# Patient Record
Sex: Male | Born: 1945 | Race: White | Hispanic: No | Marital: Married | Smoking: Never smoker
Health system: Southern US, Community
[De-identification: ages and names within clinical notes are randomized; demographics above are authoritative.]

## PROBLEM LIST (undated history)

## (undated) DIAGNOSIS — I2510 Atherosclerotic heart disease of native coronary artery without angina pectoris: Secondary | ICD-10-CM

## (undated) DIAGNOSIS — H8109 Meniere's disease, unspecified ear: Secondary | ICD-10-CM

---

## 2020-10-16 ENCOUNTER — Other Ambulatory Visit: Payer: Self-pay

## 2020-10-16 ENCOUNTER — Emergency Department (HOSPITAL_COMMUNITY): Payer: Medicare Other

## 2020-10-16 ENCOUNTER — Inpatient Hospital Stay (HOSPITAL_COMMUNITY)
Admission: EM | Admit: 2020-10-16 | Discharge: 2020-10-19 | DRG: 247 | Disposition: A | Discharge status: Home / Self Care | Payer: Medicare Other | Attending: Cardiovascular Disease | Admitting: Cardiovascular Disease

## 2020-10-16 ENCOUNTER — Encounter (HOSPITAL_COMMUNITY): Payer: Self-pay

## 2020-10-16 DIAGNOSIS — I2109 ST elevation (STEMI) myocardial infarction involving other coronary artery of anterior wall: Secondary | ICD-10-CM | POA: Diagnosis present

## 2020-10-16 DIAGNOSIS — I071 Rheumatic tricuspid insufficiency: Secondary | ICD-10-CM | POA: Diagnosis present

## 2020-10-16 DIAGNOSIS — Z881 Allergy status to other antibiotic agents status: Secondary | ICD-10-CM

## 2020-10-16 DIAGNOSIS — E1165 Type 2 diabetes mellitus with hyperglycemia: Secondary | ICD-10-CM | POA: Diagnosis present

## 2020-10-16 DIAGNOSIS — Z955 Presence of coronary angioplasty implant and graft: Secondary | ICD-10-CM

## 2020-10-16 DIAGNOSIS — I249 Acute ischemic heart disease, unspecified: Secondary | ICD-10-CM | POA: Diagnosis present

## 2020-10-16 DIAGNOSIS — K219 Gastro-esophageal reflux disease without esophagitis: Secondary | ICD-10-CM | POA: Diagnosis present

## 2020-10-16 DIAGNOSIS — I2510 Atherosclerotic heart disease of native coronary artery without angina pectoris: Secondary | ICD-10-CM | POA: Diagnosis present

## 2020-10-16 DIAGNOSIS — I255 Ischemic cardiomyopathy: Secondary | ICD-10-CM | POA: Diagnosis present

## 2020-10-16 DIAGNOSIS — I214 Non-ST elevation (NSTEMI) myocardial infarction: Secondary | ICD-10-CM

## 2020-10-16 DIAGNOSIS — Z888 Allergy status to other drugs, medicaments and biological substances status: Secondary | ICD-10-CM

## 2020-10-16 DIAGNOSIS — H8109 Meniere's disease, unspecified ear: Secondary | ICD-10-CM | POA: Diagnosis present

## 2020-10-16 DIAGNOSIS — Z20822 Contact with and (suspected) exposure to covid-19: Secondary | ICD-10-CM | POA: Diagnosis present

## 2020-10-16 DIAGNOSIS — Z79899 Other long term (current) drug therapy: Secondary | ICD-10-CM

## 2020-10-16 DIAGNOSIS — G40909 Epilepsy, unspecified, not intractable, without status epilepticus: Secondary | ICD-10-CM | POA: Diagnosis present

## 2020-10-16 HISTORY — DX: Atherosclerotic heart disease of native coronary artery without angina pectoris: I25.10

## 2020-10-16 HISTORY — DX: Meniere's disease, unspecified ear: H81.09

## 2020-10-16 LAB — BASIC METABOLIC PANEL
Anion gap: 11 (ref 5–15)
BUN: 14 mg/dL (ref 8–23)
CO2: 23 mmol/L (ref 22–32)
Calcium: 8.6 mg/dL — ABNORMAL LOW (ref 8.9–10.3)
Chloride: 99 mmol/L (ref 98–111)
Creatinine, Ser: 1.01 mg/dL (ref 0.61–1.24)
GFR, Estimated: >60 mL/min (ref >60)
Glucose, Bld: 134 mg/dL — ABNORMAL HIGH (ref 70–99)
Potassium: 3.9 mmol/L (ref 3.5–5.1)
Sodium: 133 mmol/L — ABNORMAL LOW (ref 135–145)

## 2020-10-16 LAB — CBC WITH DIFFERENTIAL/PLATELET
Abs Immature Granulocytes: 0.03 10*3/uL (ref 0.00–0.07)
Basophils Absolute: 0 10*3/uL (ref 0.0–0.1)
Basophils Relative: 1 %
Eosinophils Absolute: 0.3 10*3/uL (ref 0.0–0.5)
Eosinophils Relative: 3 %
HCT: 43.5 % (ref 39.0–52.0)
Hemoglobin: 14.6 g/dL (ref 13.0–17.0)
Immature Granulocytes: 0 %
Lymphocytes Relative: 34 %
Lymphs Abs: 3 10*3/uL (ref 0.7–4.0)
MCH: 32.3 pg (ref 26.0–34.0)
MCHC: 33.6 g/dL (ref 30.0–36.0)
MCV: 96.2 fL (ref 80.0–100.0)
Monocytes Absolute: 0.7 10*3/uL (ref 0.1–1.0)
Monocytes Relative: 7 %
Neutro Abs: 4.8 10*3/uL (ref 1.7–7.7)
Neutrophils Relative %: 55 %
Platelets: 228 10*3/uL (ref 150–400)
RBC: 4.52 MIL/uL (ref 4.22–5.81)
RDW: 12.3 % (ref 11.5–15.5)
WBC: 8.8 10*3/uL (ref 4.0–10.5)
nRBC: 0 % (ref 0.0–0.2)

## 2020-10-16 LAB — TROPONIN I (HIGH SENSITIVITY)
Troponin I (High Sensitivity): 530 ng/L (ref <18)
Troponin I (High Sensitivity): 1700 ng/L (ref <18)

## 2020-10-16 LAB — RESPIRATORY PANEL BY RT PCR (FLU A&B, COVID)
Influenza A by PCR: NEGATIVE
Influenza B by PCR: NEGATIVE
SARS Coronavirus 2 by RT PCR: NEGATIVE

## 2020-10-16 LAB — BRAIN NATRIURETIC PEPTIDE: B Natriuretic Peptide: 36.3 pg/mL (ref 0.0–100.0)

## 2020-10-16 MED ORDER — ATORVASTATIN CALCIUM 80 MG PO TABS
80.0000 mg | ORAL_TABLET | Freq: Every day | ORAL | Status: DC
  Administered 2020-10-18 – 2020-10-19 (×2): 80 mg via ORAL
  Filled 2020-10-16 (×2): qty 1

## 2020-10-16 MED ORDER — SODIUM CHLORIDE 0.9% FLUSH: 3.0000 mL | INTRAVENOUS | Status: DC | PRN

## 2020-10-16 MED ORDER — NITROGLYCERIN IN D5W 200-5 MCG/ML-% IV SOLN
0.0000 ug/min | INTRAVENOUS | Status: DC
  Administered 2020-10-16: 5 ug/min via INTRAVENOUS
  Administered 2020-10-17: 15 ug/min via INTRAVENOUS
  Filled 2020-10-16: qty 250

## 2020-10-16 MED ORDER — FAMOTIDINE 20 MG PO TABS
40.0000 mg | ORAL_TABLET | Freq: Every day | ORAL | Status: DC
  Administered 2020-10-17 – 2020-10-19 (×3): 40 mg via ORAL
  Filled 2020-10-16 (×3): qty 2

## 2020-10-16 MED ORDER — HEPARIN SODIUM (PORCINE) 5000 UNIT/ML IJ SOLN
4000.0000 [IU] | Freq: Once | INTRAMUSCULAR | Status: AC
  Administered 2020-10-16: 4000 [IU] via INTRAVENOUS
  Filled 2020-10-16: qty 1

## 2020-10-16 MED ORDER — HEPARIN (PORCINE) 25000 UT/250ML-% IV SOLN
1000.0000 [IU]/h | INTRAVENOUS | Status: DC
  Administered 2020-10-16 – 2020-10-17 (×2): 1000 [IU]/h via INTRAVENOUS
  Filled 2020-10-16: qty 250

## 2020-10-16 MED ORDER — ASPIRIN EC 81 MG PO TBEC: 81.0000 mg | DELAYED_RELEASE_TABLET | Freq: Every day | ORAL | Status: DC

## 2020-10-16 MED ORDER — SODIUM CHLORIDE 0.9 % IV SOLN: 250.0000 mL | INTRAVENOUS | Status: DC | PRN

## 2020-10-16 MED ORDER — ONDANSETRON HCL 4 MG/2ML IJ SOLN: 4.0000 mg | Freq: Four times a day (QID) | INTRAMUSCULAR | Status: DC | PRN

## 2020-10-16 MED ORDER — ACETAMINOPHEN 325 MG PO TABS: 650.0000 mg | ORAL_TABLET | ORAL | Status: DC | PRN

## 2020-10-16 MED ORDER — SODIUM CHLORIDE 0.9% FLUSH
3.0000 mL | Freq: Two times a day (BID) | INTRAVENOUS | Status: DC
  Administered 2020-10-17 – 2020-10-19 (×3): 3 mL via INTRAVENOUS

## 2020-10-16 MED ORDER — METOPROLOL SUCCINATE ER 25 MG PO TB24
25.0000 mg | ORAL_TABLET | Freq: Every day | ORAL | Status: DC
  Administered 2020-10-18 – 2020-10-19 (×2): 25 mg via ORAL
  Filled 2020-10-16 (×2): qty 1

## 2020-10-16 MED ORDER — PHENYTOIN SODIUM EXTENDED 100 MG PO CAPS
200.0000 mg | ORAL_CAPSULE | Freq: Two times a day (BID) | ORAL | Status: DC
  Administered 2020-10-17 – 2020-10-19 (×4): 200 mg via ORAL
  Filled 2020-10-16 (×5): qty 2

## 2020-10-16 MED ORDER — ALPRAZOLAM 0.25 MG PO TABS: 0.2500 mg | ORAL_TABLET | Freq: Two times a day (BID) | ORAL | Status: DC | PRN

## 2020-10-16 NOTE — ED Provider Notes (Signed)
[facility]   CSN: [id number] Arrival date & time: 10/16/20  1914     History Chief Complaint  Patient presents with  . Chest Pain    Joshua LauthRobert L Kent is a 74 y.o. male.  The history is provided by the patient and medical records.  Chest Pain Pain location:  Epigastric Pain quality: sharp and stabbing   Pain radiates to:  Does not radiate Pain severity:  Severe Onset quality:  Sudden Duration:  3 hours Timing:  Constant Chronicity:  New (new today but prior CP) Associated symptoms: diaphoresis, dysphagia, nausea and shortness of breath   Associated symptoms: no abdominal pain, no back pain, no cough, no fever and no headache        Past Medical History:  Diagnosis Date  . Meniere disease    Per pt    Patient Active Problem List   Diagnosis Date Noted  . Acute coronary syndrome (HCC) 10/16/2020    History reviewed. No pertinent surgical history.     History reviewed. No pertinent family history.  Social History   Tobacco Use  . Smoking status: Never Smoker  . Smokeless tobacco: Never Used  Substance Use Topics  . Alcohol use: Never  . Drug use: Never    Home Medications Prior to Admission medications   Medication Sig Start Date End Date Taking? Authorizing Provider  famotidine (PEPCID) 40 MG tablet Take 40 mg by mouth daily. 10/16/20  Yes [provider]  metoprolol succinate (TOPROL-XL) 50 MG 24 hr tablet Take 25 mg by mouth daily.  04/24/20  Yes [provider]  nitroGLYCERIN (NITROSTAT) 0.3 MG SL tablet Place 0.3 mg under the tongue every 5 (five) minutes as needed for chest pain.  10/16/20  Yes [provider]  phenytoin (DILANTIN) 100 MG ER capsule Take 200 mg by mouth 2 (two) times daily.  09/30/20  Yes [provider]  pravastatin (PRAVACHOL) 40 MG tablet Take 40 mg by mouth daily. 10/13/20  Yes [provider]    Allergies    Cetirizine and  Ramipril  Review of Systems   Review of Systems  Constitutional: Positive for diaphoresis. Negative for chills and fever.  HENT: Positive for trouble swallowing. Negative for congestion and sore throat.   Respiratory: Positive for shortness of breath. Negative for cough.   Cardiovascular: Positive for chest pain.  Gastrointestinal: Positive for nausea. Negative for abdominal pain and diarrhea.  Musculoskeletal: Negative for back pain and neck pain.  Skin: Negative for rash.  Neurological: Positive for light-headedness. Negative for headaches.  All other systems reviewed and are negative.   Physical Exam Updated Vital Signs BP 124/81   Pulse 71   Temp 97.9 F (36.6 C) (Oral)   Resp 16   Ht 5\' 8"  (1.727 m)   Wt 80.7 kg   SpO2 97%   BMI 27.06 kg/m   Physical Exam Vitals reviewed.  Constitutional:      General: He is not in acute distress.    Appearance: He is well-developed. He is not diaphoretic.  HENT:     Head: Normocephalic and atraumatic.     Nose: Nose normal.     Mouth/Throat:     Mouth: Mucous membranes are moist.     Pharynx: Oropharynx is clear.  Eyes:     Conjunctiva/sclera: Conjunctivae normal.  Cardiovascular:     Rate and Rhythm: Normal rate and regular rhythm.     Heart sounds: Normal heart sounds.  Pulmonary:  Effort: Pulmonary effort is normal. No respiratory distress.     Breath sounds: Normal breath sounds. No wheezing or rales.  Abdominal:     General: Abdomen is flat.     Palpations: Abdomen is soft.     Tenderness: There is no abdominal tenderness.  Musculoskeletal:     Cervical back: Normal range of motion and neck supple.     Right lower leg: No edema.     Left lower leg: No edema.  Skin:    General: Skin is warm and dry.  Neurological:     Mental Status: He is alert.  Psychiatric:        Mood and Affect: Mood normal.        Behavior: Behavior normal.     ED Results / Procedures / Treatments   Labs (all labs ordered are  listed, but only abnormal results are displayed) Labs Reviewed  BASIC METABOLIC PANEL - Abnormal; Notable for the following components:      Result Value   Sodium 133 (*)    Glucose, Bld 134 (*)    Calcium 8.6 (*)    All other components within normal limits  TROPONIN I (HIGH SENSITIVITY) - Abnormal; Notable for the following components:   Troponin I (High Sensitivity) 530 (*)    All other components within normal limits  TROPONIN I (HIGH SENSITIVITY) - Abnormal; Notable for the following components:   Troponin I (High Sensitivity) 1,700 (*)    All other components within normal limits  RESPIRATORY PANEL BY RT PCR (FLU A&B, COVID)  CBC WITH DIFFERENTIAL/PLATELET  BRAIN NATRIURETIC PEPTIDE  HEPARIN LEVEL (UNFRACTIONATED)  BASIC METABOLIC PANEL  LIPID PANEL  CBC  PROTIME-INR    EKG EKG Interpretation  Date/Time:  Monday October 16 2020 19:26:23 EDT Ventricular Rate:  68 PR Interval:    QRS Duration: 94 QT Interval:  389 QTC Calculation: 414 R Axis:   -34 Text Interpretation: Sinus rhythm Left axis deviation Low voltage, precordial leads Consider anterolateral infarct STE in I and AVL but not meeting criteria Confirmed by Marily Memos [phone]) on 10/16/2020 7:28:08 PM    EKG Interpretation  Date/Time:  Monday October 16 2020 20:16:18 EDT Ventricular Rate:  68 PR Interval:    QRS Duration: 92 QT Interval:  387 QTC Calculation: 412 R Axis:   -35 Text Interpretation: Sinus rhythm Left axis deviation Low voltage, precordial leads Consider anterolateral infarct similar to earlier in day Confirmed by Marily Memos [phone]) on 10/16/2020 8:32:36 PM        Radiology DG Chest Portable 1 View  Result Date: 10/16/2020 CLINICAL DATA:  Chest pain. EXAM: PORTABLE CHEST 1 VIEW COMPARISON:  March 16, 2016 FINDINGS: The heart size and mediastinal contours are within normal limits. Both lungs are clear. The visualized skeletal structures are unremarkable. IMPRESSION: No active  disease. Electronically Signed   By: Aram Candela M.D.   On: 10/16/2020 21:03    Procedures Procedures (including critical care time)  Medications Ordered in ED Medications  heparin ADULT infusion 100 units/mL (25000 units/214mL sodium chloride 0.45%) (1,000 Units/hr Intravenous New Bag/Given 10/16/20 2043)  nitroGLYCERIN 50 mg in dextrose 5 % 250 mL (0.2 mg/mL) infusion (15 mcg/min Intravenous Rate/Dose Change 10/16/20 2107)  aspirin EC tablet 81 mg (has no administration in time range)  acetaminophen (TYLENOL) tablet 650 mg (has no administration in time range)  ondansetron (ZOFRAN) injection 4 mg (has no administration in time range)  sodium chloride flush (NS) 0.9 % injection 3 mL (has no  administration in time range)  sodium chloride flush (NS) 0.9 % injection 3 mL (has no administration in time range)  0.9 %  sodium chloride infusion (has no administration in time range)  ALPRAZolam (XANAX) tablet 0.25 mg (has no administration in time range)  famotidine (PEPCID) tablet 40 mg (has no administration in time range)  metoprolol succinate (TOPROL-XL) 24 hr tablet 25 mg (has no administration in time range)  phenytoin (DILANTIN) ER capsule 200 mg (has no administration in time range)  atorvastatin (LIPITOR) tablet 80 mg (has no administration in time range)  heparin injection 4,000 Units (4,000 Units Intravenous Given 10/16/20 2024)    ED Course  I have reviewed the triage vital signs and the nursing notes.  Pertinent labs & imaging results that were available during my care of the patient were reviewed by me and considered in my medical decision making (see chart for details).    MDM Rules/Calculators/A&P                           Medical Decision Making: RAYANE GALLARDO is a 74 y.o. male who presented to the ED today with chest pain. Pt reports ongoing CP. Seen by PCP today, told to take nitro for CP, however pain has continued. Pt called EMS due to diaphoresis and pain,  has received 4 SL nitro PTA and 324 ASA  PCP note reports 1 month of chest pressure sx related to exertion (relieved by rest) and triggered by eating. Started on famotidine 40mg  daily and Nitrostat SL 0.3mg  tablets as needed, Decrease Aspirin to three times a week, given referral to cardiology. Advised to return to clinic in 2 weeks as scheduled. EKG from clinic shows T wave inversion new in anterior leads.   Past medical history significant for meniere disease  Reviewed and confirmed nursing documentation for past medical history, family history, social history.  On my initial exam, the pt was in NAD, not diaphoretic, normal work of breathing, lungs CTAB, soft non tender abdomen.   EKG here with ST elevation in leads I, aVL, depression in lead III.   Trop elevated 530 Concern for ACS, likely  NSTEMI Heparin given.   Cardiology consulted for admission, possible cardiac catheterization discussed.   All radiology and laboratory studies reviewed independently and with my attending physician, agree with reading provided by radiologist unless otherwise noted.   Based on the above findings, I believe patient requires admission.   The above care was discussed with and agreed upon by my attending physician.  Emergency Department Medication Summary:  Medications  heparin ADULT infusion 100 units/mL (25000 units/226mL sodium chloride 0.45%) (1,000 Units/hr Intravenous New Bag/Given 10/16/20 2043)  nitroGLYCERIN 50 mg in dextrose 5 % 250 mL (0.2 mg/mL) infusion (15 mcg/min Intravenous Rate/Dose Change 10/16/20 2107)  aspirin EC tablet 81 mg (has no administration in time range)  acetaminophen (TYLENOL) tablet 650 mg (has no administration in time range)  ondansetron (ZOFRAN) injection 4 mg (has no administration in time range)  sodium chloride flush (NS) 0.9 % injection 3 mL (has no administration in time range)  sodium chloride flush (NS) 0.9 % injection 3 mL (has no administration in time  range)  0.9 %  sodium chloride infusion (has no administration in time range)  ALPRAZolam (XANAX) tablet 0.25 mg (has no administration in time range)  famotidine (PEPCID) tablet 40 mg (has no administration in time range)  metoprolol succinate (TOPROL-XL) 24 hr tablet 25 mg (has  no administration in time range)  phenytoin (DILANTIN) ER capsule 200 mg (has no administration in time range)  atorvastatin (LIPITOR) tablet 80 mg (has no administration in time range)  heparin injection 4,000 Units (4,000 Units Intravenous Given 10/16/20 2024)         Final Clinical Impression(s) / ED Diagnoses Final diagnoses:  NSTEMI (non-ST elevated myocardial infarction) [facility])    Rx / DC Orders ED Discharge Orders    None       Brantley Fling, MD 10/17/20 Lazarus Gowda    Marily Memos, MD 10/17/20 [phone]

## 2020-10-16 NOTE — ED Triage Notes (Signed)
Pt coming from home with ongoing CP. Pt saw a doctor who rx him nitro to take at home. Pt reports it continued and progressively got worse in center of chest. Pt called EMS due to diaphoresis and pain. Pt has received 4 SL nitro PTA and 324 ASA.

## 2020-10-16 NOTE — Progress Notes (Signed)
ANTICOAGULATION CONSULT NOTE - Initial Consult  Pharmacy Consult for heparin gtt Indication: chest pain/ACS  Allergies  Allergen Reactions   Cetirizine Other (See Comments)   Ramipril Other (See Comments)    Patient Measurements: Height: 5\' 8"  (172.7 cm) Weight: 80.7 kg (178 lb) IBW/kg (Calculated) : 68.4 Heparin Dosing Weight: 80.7kg  Vital Signs: Temp: 97.9 F (36.6 C) (10/25 1930) Temp Source: Oral (10/25 1930) BP: 133/88 (10/25 1930) Pulse Rate: 66 (10/25 1930)  Labs: Recent Labs    10/16/20 1925  HGB 14.6  HCT 43.5  PLT 228  CREATININE 1.01  TROPONINIHS 530*    Estimated Creatinine Clearance: 61.1 mL/min (by C-G formula based on SCr of 1.01 mg/dL).   Medical History: Past Medical History:  Diagnosis Date   Meniere disease    Per pt     Assessment: 74 year old male presenting with persistent chest pain despite 4x SL nitro. Pt troponin 530 today and pharmacy consulted to dose heparin for ACS  CBC WNL:  Hgb 14.6; Plt 228  No anticoagulants reported PTA  Goal of Therapy:  Heparin level 0.3-0.7 units/ml Monitor platelets by anticoagulation protocol: Yes   Plan:  Heparin bolus 4000 units x1 already given Heparin infusion 1000 units/hr 8h HL at 0500 on 10/26 Monitor CBC, HL, s/sx bleeding ,and clinical progress  11/26  PGY1 Pharmacy resident 10/16/2020,8:31 PM

## 2020-10-16 NOTE — ED Notes (Signed)
Date and time results received: 10/16/20 2016  Test: Troponin Critical Value: 530  Name of Provider Notified: MD Mesner  Orders Received? Or Actions Taken?: Awaiting orders

## 2020-10-16 NOTE — H&P (Signed)
Referring Physician: Dr. Marily Memos  Joshua Kent is an 74 y.o. male.                       Chief Complaint: Chest pain  HPI: 74 years old white male with PMH of Meniere's disease has 1 month h/o of chest pain with exertion. Today his chest pain persisted post taking one SL NTG prescribed by his doctor. He had sweating spell. Chest pain is epigastric in location, sharp, constant and non-radiating with diaphoresis and shortness of breath.  His HS-troponin I is 530 ng. BNP is normal. EKG shows subtle ST elevation in lead I and aVL. Chest x-ray is unremarkable. CBC and BMET are unremarkable except mild hyperglycemia. He denies h/o GI or GU bleed.  Past Medical History:  Diagnosis Date  . Meniere disease    Per pt      History reviewed. No pertinent surgical history.  History reviewed. No pertinent family history. Social History:  reports that he has never smoked. He has never used smokeless tobacco. He reports that he does not drink alcohol and does not use drugs.  Allergies:  Allergies  Allergen Reactions  . Cetirizine Other (See Comments)  . Ramipril Other (See Comments)    (Not in a hospital admission)   Results for orders placed or performed during the hospital encounter of 10/16/20 (from the past 48 hour(s))  CBC with Differential     Status: None   Collection Time: 10/16/20  7:25 PM  Result Value Ref Range   WBC 8.8 4.0 - 10.5 K/uL   RBC 4.52 4.22 - 5.81 MIL/uL   Hemoglobin 14.6 13.0 - 17.0 g/dL   HCT 03.0 39 - 52 %   MCV 96.2 80.0 - 100.0 fL   MCH 32.3 26.0 - 34.0 pg   MCHC 33.6 30.0 - 36.0 g/dL   RDW 09.2 33.0 - 07.6 %   Platelets 228 150 - 400 K/uL   nRBC 0.0 0.0 - 0.2 %   Neutrophils Relative % 55 %   Neutro Abs 4.8 1.7 - 7.7 K/uL   Lymphocytes Relative 34 %   Lymphs Abs 3.0 0.7 - 4.0 K/uL   Monocytes Relative 7 %   Monocytes Absolute 0.7 0.1 - 1.0 K/uL   Eosinophils Relative 3 %   Eosinophils Absolute 0.3 0.0 - 0.5 K/uL   Basophils Relative 1 %    Basophils Absolute 0.0 0.0 - 0.1 K/uL   Immature Granulocytes 0 %   Abs Immature Granulocytes 0.03 0.00 - 0.07 K/uL    Comment: Performed at [facility], [address], [place], [place] [address]  Basic metabolic panel     Status: Abnormal   Collection Time: 10/16/20  7:25 PM  Result Value Ref Range   Sodium 133 (L) 135 - 145 mmol/L   Potassium 3.9 3.5 - 5.1 mmol/L   Chloride 99 98 - 111 mmol/L   CO2 23 22 - 32 mmol/L   Glucose, Bld 134 (H) 70 - 99 mg/dL    Comment: Glucose reference range applies only to samples taken after fasting for at least 8 hours.   BUN 14 8 - 23 mg/dL   Creatinine, Ser 3.54 0.61 - 1.24 mg/dL   Calcium 8.6 (L) 8.9 - 10.3 mg/dL   GFR, Estimated >56 >25 mL/min    Comment: (NOTE) Calculated using the CKD-EPI Creatinine Equation (2021)    Anion gap 11 5 - 15    Comment: Performed at  [facility], [address] [place], [place] [address]  Troponin I (High Sensitivity)     Status: Abnormal   Collection Time: 10/16/20  7:25 PM  Result Value Ref Range   Troponin I (High Sensitivity) 530 (HH) <18 ng/L    Comment: CRITICAL RESULT CALLED TO, READ BACK BY AND VERIFIED WITH: A.BIENIEK RN 2013 10/16/20 MCCORMICK K (NOTE) Elevated high sensitivity troponin I (hsTnI) values and significant  changes across serial measurements may suggest ACS but many other  chronic and acute conditions are known to elevate hsTnI results.  Refer to the Links section for chest pain algorithms and additional  guidance. Performed at [facility], [address], [place], [place] [address]   Brain natriuretic peptide     Status: None   Collection Time: 10/16/20  7:25 PM  Result Value Ref Range   B Natriuretic Peptide 36.3 0.0 - 100.0 pg/mL    Comment: Performed at [facility], [address] [place], [place] [address]   DG Chest Portable 1 View  Result Date: 10/16/2020 CLINICAL DATA:  Chest pain. EXAM: PORTABLE CHEST 1 VIEW COMPARISON:  March 16, 2016  FINDINGS: The heart size and mediastinal contours are within normal limits. Both lungs are clear. The visualized skeletal structures are unremarkable. IMPRESSION: No active disease. Electronically Signed   By: Aram Candela M.D.   On: 10/16/2020 21:03    Review Of Systems Constitutional: No fever, chills, weight loss or gain. Eyes: No vision change, wears glasses. No discharge or pain. Ears: No hearing loss, No tinnitus. Respiratory: No asthma, COPD, pneumonias. Positive shortness of breath. No hemoptysis. Cardiovascular: Positive chest pain, no palpitation, no leg edema. Gastrointestinal: No nausea, vomiting, diarrhea, constipation. No GI bleed. No hepatitis. Genitourinary: No dysuria, hematuria, kidney stone. No incontinance. Neurological: No headache, stroke, seizures.  Psychiatry: No psych facility admission for anxiety, depression, suicide. No detox. Skin: No rash. Musculoskeletal: Positive joint pain, no fibromyalgia. No neck pain, back pain. Lymphadenopathy: No lymphadenopathy. Hematology: No anemia or easy bruising.   Blood pressure 121/86, pulse 67, temperature 97.9 F (36.6 C), temperature source Oral, resp. rate 14, height 5\' 8"  (1.727 m), weight 80.7 kg, SpO2 97 %. Body mass index is 27.06 kg/m. General appearance: alert, cooperative, appears stated age and mild distress Head: Normocephalic, atraumatic. Eyes: Blue eyes, pink conjunctiva, corneas clear. PERRL, EOM's intact. Neck: No adenopathy, no carotid bruit, no JVD, supple, symmetrical, trachea midline and thyroid not enlarged. Resp: Clear to auscultation bilaterally. Cardio: Regular rate and rhythm, S1, S2 normal, II/VI systolic murmur, no click, rub or gallop GI: Soft, non-tender; bowel sounds normal; no organomegaly. Extremities: No edema, cyanosis or clubbing. Skin: Warm and dry.  Neurologic: Alert and oriented X 3, normal strength.  Assessment/Plan Acute coronary syndrome GERD Seizure disorder  Admit/IV  NTG/IV heparin/Beta-blocker and Atorvastatin. Discussed cardiac catheterization procedure, risks and alternatives. Patient agreeable to the procedure.  Time spent: Review of old records, Lab, x-rays, EKG, other cardiac tests, examination, discussion with patient, referring doctor and wife over 70 minutes.  , MD  10/16/2020, 9:15 PM

## 2020-10-17 ENCOUNTER — Encounter (HOSPITAL_COMMUNITY): Payer: Self-pay | Admitting: Cardiovascular Disease

## 2020-10-17 ENCOUNTER — Other Ambulatory Visit (HOSPITAL_COMMUNITY): Payer: Medicare Other

## 2020-10-17 ENCOUNTER — Encounter (HOSPITAL_COMMUNITY)
Admission: EM | Disposition: A | Discharge status: Home / Self Care | Payer: Self-pay | Source: Home / Self Care | Attending: Cardiovascular Disease

## 2020-10-17 DIAGNOSIS — I2510 Atherosclerotic heart disease of native coronary artery without angina pectoris: Secondary | ICD-10-CM | POA: Diagnosis not present

## 2020-10-17 HISTORY — PX: CORONARY STENT INTERVENTION: CATH118234

## 2020-10-17 HISTORY — PX: LEFT HEART CATH AND CORONARY ANGIOGRAPHY: CATH118249

## 2020-10-17 LAB — CBC
HCT: 46.7 % (ref 39.0–52.0)
Hemoglobin: 15.8 g/dL (ref 13.0–17.0)
MCH: 32.5 pg (ref 26.0–34.0)
MCHC: 33.8 g/dL (ref 30.0–36.0)
MCV: 96.1 fL (ref 80.0–100.0)
Platelets: 214 10*3/uL (ref 150–400)
RBC: 4.86 MIL/uL (ref 4.22–5.81)
RDW: 12.5 % (ref 11.5–15.5)
WBC: 11.6 10*3/uL — ABNORMAL HIGH (ref 4.0–10.5)
nRBC: 0 % (ref 0.0–0.2)

## 2020-10-17 LAB — BASIC METABOLIC PANEL
Anion gap: 11 (ref 5–15)
BUN: 10 mg/dL (ref 8–23)
CO2: 24 mmol/L (ref 22–32)
Calcium: 9 mg/dL (ref 8.9–10.3)
Chloride: 101 mmol/L (ref 98–111)
Creatinine, Ser: 0.96 mg/dL (ref 0.61–1.24)
GFR, Estimated: >60 mL/min (ref >60)
Glucose, Bld: 144 mg/dL — ABNORMAL HIGH (ref 70–99)
Potassium: 4.1 mmol/L (ref 3.5–5.1)
Sodium: 136 mmol/L (ref 135–145)

## 2020-10-17 LAB — GLUCOSE, CAPILLARY: Glucose-Capillary: 142 mg/dL — ABNORMAL HIGH (ref 70–99)

## 2020-10-17 LAB — LIPID PANEL
Cholesterol: 175 mg/dL (ref 0–200)
HDL: 45 mg/dL
LDL Cholesterol: 97 mg/dL (ref 0–99)
Total CHOL/HDL Ratio: 3.9 ratio
Triglycerides: 166 mg/dL — ABNORMAL HIGH
VLDL: 33 mg/dL (ref 0–40)

## 2020-10-17 LAB — POCT ACTIVATED CLOTTING TIME
Activated Clotting Time: 252 seconds
Activated Clotting Time: 285 seconds
Activated Clotting Time: 285 seconds
Activated Clotting Time: 213 seconds
Activated Clotting Time: 175 seconds

## 2020-10-17 LAB — PROTIME-INR
INR: 1.1 (ref 0.8–1.2)
Prothrombin Time: 13.3 seconds (ref 11.4–15.2)

## 2020-10-17 LAB — HEPARIN LEVEL (UNFRACTIONATED): Heparin Unfractionated: 0.27 IU/mL — ABNORMAL LOW (ref 0.30–0.70)

## 2020-10-17 SURGERY — LEFT HEART CATH AND CORONARY ANGIOGRAPHY
Anesthesia: LOCAL

## 2020-10-17 MED ORDER — SODIUM CHLORIDE 0.9 % WEIGHT BASED INFUSION
1.0000 mL/kg/h | INTRAVENOUS | Status: DC
  Administered 2020-10-17: 1 mL/kg/h via INTRAVENOUS

## 2020-10-17 MED ORDER — ASPIRIN 81 MG PO CHEW
81.0000 mg | CHEWABLE_TABLET | Freq: Every day | ORAL | Status: DC
  Administered 2020-10-18 – 2020-10-19 (×2): 81 mg via ORAL
  Filled 2020-10-17 (×2): qty 1

## 2020-10-17 MED ORDER — NITROGLYCERIN 1 MG/10 ML FOR IR/CATH LAB
INTRA_ARTERIAL | Status: DC | PRN
  Administered 2020-10-17 (×2): 200 ug via INTRACORONARY
  Administered 2020-10-17: 200 ug

## 2020-10-17 MED ORDER — LIDOCAINE HCL (PF) 1 % IJ SOLN
INTRAMUSCULAR | Status: AC
  Filled 2020-10-17: qty 30

## 2020-10-17 MED ORDER — TICAGRELOR 90 MG PO TABS
90.0000 mg | ORAL_TABLET | Freq: Two times a day (BID) | ORAL | Status: DC
  Administered 2020-10-17 – 2020-10-18 (×2): 90 mg via ORAL
  Filled 2020-10-17 (×2): qty 1

## 2020-10-17 MED ORDER — SODIUM CHLORIDE 0.9% FLUSH: 3.0000 mL | INTRAVENOUS | Status: DC | PRN

## 2020-10-17 MED ORDER — SODIUM CHLORIDE 0.9 % WEIGHT BASED INFUSION: 1.0000 mL/kg/h | INTRAVENOUS | Status: AC

## 2020-10-17 MED ORDER — FENTANYL CITRATE (PF) 100 MCG/2ML IJ SOLN
INTRAMUSCULAR | Status: AC
  Filled 2020-10-17: qty 2

## 2020-10-17 MED ORDER — IOHEXOL 350 MG/ML SOLN
INTRAVENOUS | Status: DC | PRN
  Administered 2020-10-17: 90 mL via INTRA_ARTERIAL

## 2020-10-17 MED ORDER — ALUM & MAG HYDROXIDE-SIMETH 200-200-20 MG/5ML PO SUSP
30.0000 mL | ORAL | Status: DC | PRN
  Administered 2020-10-17 – 2020-10-19 (×2): 30 mL via ORAL
  Filled 2020-10-17: qty 30

## 2020-10-17 MED ORDER — FENTANYL CITRATE (PF) 100 MCG/2ML IJ SOLN
INTRAMUSCULAR | Status: DC | PRN
  Administered 2020-10-17: 12.5 ug via INTRAVENOUS

## 2020-10-17 MED ORDER — HEPARIN (PORCINE) IN NACL 1000-0.9 UT/500ML-% IV SOLN
INTRAVENOUS | Status: DC | PRN
  Administered 2020-10-17 (×2): 500 mL

## 2020-10-17 MED ORDER — TICAGRELOR 90 MG PO TABS
ORAL_TABLET | ORAL | Status: AC
  Filled 2020-10-17: qty 1

## 2020-10-17 MED ORDER — HYDRALAZINE HCL 20 MG/ML IJ SOLN: 10.0000 mg | INTRAMUSCULAR | Status: AC | PRN

## 2020-10-17 MED ORDER — HEPARIN SODIUM (PORCINE) 1000 UNIT/ML IJ SOLN
INTRAMUSCULAR | Status: DC | PRN
  Administered 2020-10-17: 2000 [IU] via INTRAVENOUS
  Administered 2020-10-17: 8000 [IU] via INTRAVENOUS
  Administered 2020-10-17: 2000 [IU] via INTRAVENOUS

## 2020-10-17 MED ORDER — DIAZEPAM 5 MG PO TABS: 5.0000 mg | ORAL_TABLET | Freq: Four times a day (QID) | ORAL | Status: DC | PRN

## 2020-10-17 MED ORDER — IOHEXOL 350 MG/ML SOLN
INTRAVENOUS | Status: DC | PRN
  Administered 2020-10-17: 60 mL

## 2020-10-17 MED ORDER — MIDAZOLAM HCL 2 MG/2ML IJ SOLN
INTRAMUSCULAR | Status: DC | PRN
  Administered 2020-10-17: 0.5 mg via INTRAVENOUS

## 2020-10-17 MED ORDER — SODIUM CHLORIDE 0.9% FLUSH
3.0000 mL | Freq: Two times a day (BID) | INTRAVENOUS | Status: DC
  Administered 2020-10-18: 3 mL via INTRAVENOUS

## 2020-10-17 MED ORDER — HEPARIN (PORCINE) IN NACL 1000-0.9 UT/500ML-% IV SOLN
INTRAVENOUS | Status: AC
  Filled 2020-10-17: qty 1000

## 2020-10-17 MED ORDER — ACETAMINOPHEN 325 MG PO TABS
650.0000 mg | ORAL_TABLET | ORAL | Status: DC | PRN
  Administered 2020-10-17 – 2020-10-18 (×2): 650 mg via ORAL
  Filled 2020-10-17 (×2): qty 2

## 2020-10-17 MED ORDER — ONDANSETRON HCL 4 MG/2ML IJ SOLN: 4.0000 mg | Freq: Four times a day (QID) | INTRAMUSCULAR | Status: DC | PRN

## 2020-10-17 MED ORDER — SODIUM CHLORIDE 0.9 % WEIGHT BASED INFUSION: 3.0000 mL/kg/h | INTRAVENOUS | Status: DC

## 2020-10-17 MED ORDER — TICAGRELOR 90 MG PO TABS
ORAL_TABLET | ORAL | Status: DC | PRN
  Administered 2020-10-17: 180 mg via ORAL

## 2020-10-17 MED ORDER — SODIUM CHLORIDE 0.9 % IV SOLN: 250.0000 mL | INTRAVENOUS | Status: DC | PRN

## 2020-10-17 MED ORDER — LABETALOL HCL 5 MG/ML IV SOLN: 10.0000 mg | INTRAVENOUS | Status: AC | PRN

## 2020-10-17 MED ORDER — HEPARIN SODIUM (PORCINE) 1000 UNIT/ML IJ SOLN
INTRAMUSCULAR | Status: AC
  Filled 2020-10-17: qty 1

## 2020-10-17 MED ORDER — NITROGLYCERIN 1 MG/10 ML FOR IR/CATH LAB
INTRA_ARTERIAL | Status: AC
  Filled 2020-10-17: qty 10

## 2020-10-17 MED ORDER — ASPIRIN 81 MG PO CHEW
81.0000 mg | CHEWABLE_TABLET | ORAL | Status: AC
  Administered 2020-10-17: 81 mg via ORAL
  Filled 2020-10-17: qty 1

## 2020-10-17 MED ORDER — MIDAZOLAM HCL 2 MG/2ML IJ SOLN
INTRAMUSCULAR | Status: AC
  Filled 2020-10-17: qty 2

## 2020-10-17 MED ORDER — LIDOCAINE HCL (PF) 1 % IJ SOLN
INTRAMUSCULAR | Status: DC | PRN
  Administered 2020-10-17: 15 mL

## 2020-10-17 SURGICAL SUPPLY — 14 items
BALLN SAPPHIRE 2.5X12 (BALLOONS) ×2
BALLN SAPPHIRE ~~LOC~~ 3.25X12 (BALLOONS) ×1 IMPLANT
BALLOON SAPPHIRE 2.5X12 (BALLOONS) IMPLANT
CATH INFINITI 5FR MULTPACK ANG (CATHETERS) ×1 IMPLANT
CATH VISTA GUIDE 6FR XB3.5 (CATHETERS) ×1 IMPLANT
KIT ENCORE 26 ADVANTAGE (KITS) ×1 IMPLANT
KIT HEART LEFT (KITS) ×2 IMPLANT
PACK CARDIAC CATHETERIZATION (CUSTOM PROCEDURE TRAY) ×2 IMPLANT
SHEATH PINNACLE 5F 10CM (SHEATH) ×1 IMPLANT
SHEATH PINNACLE 6F 10CM (SHEATH) ×1 IMPLANT
STENT RESOLUTE ONYX 3.0X15 (Permanent Stent) ×1 IMPLANT
SYR MEDRAD MARK 7 150ML (SYRINGE) ×2 IMPLANT
TRANSDUCER W/STOPCOCK (MISCELLANEOUS) ×2 IMPLANT
WIRE COUGAR XT STRL 190CM (WIRE) ×1 IMPLANT

## 2020-10-17 NOTE — Interval H&P Note (Signed)
History and Physical Interval Note:  10/17/2020 7:38 AM  Joshua Kent  has presented today for surgery, with the diagnosis of NSTEMI.  The various methods of treatment have been discussed with the patient and family. After consideration of risks, benefits and other options for treatment, the patient has consented to  Procedure(s): LEFT HEART CATH AND CORONARY ANGIOGRAPHY (N/A) as a surgical intervention.  The patient's history has been reviewed, patient examined, no change in status, stable for surgery.  I have reviewed the patient's chart and labs.  Questions were answered to the patient's satisfaction.     Ricki Rodriguez

## 2020-10-17 NOTE — Progress Notes (Addendum)
Site area: rt groin arterial site Site Prior to Removal:  Level 0 Pressure Applied For: 20 minutes Manual:   yes Patient Status During Pull:  stable Post Pull Site:  Level 0 Post Pull Instructions Given:  yes Post Pull Pulses Present: rt dp palpable Dressing Applied:  Gauze and tegaderm Bedrest begins @ 1245 Comments:

## 2020-10-17 NOTE — Progress Notes (Signed)
Pt had been prev c/o HA, denies CP, nitro IV titrated down. Pt SBP 92, no CP. Nitroglycerin stopped. Emelda Brothers RN

## 2020-10-18 ENCOUNTER — Inpatient Hospital Stay (HOSPITAL_COMMUNITY): Payer: Medicare Other

## 2020-10-18 LAB — ECHOCARDIOGRAM COMPLETE
Area-P 1/2: 6.71 cm2
Height: 68 in
S' Lateral: 3.3 cm
Weight: 2744 oz

## 2020-10-18 LAB — BASIC METABOLIC PANEL
Anion gap: 10 (ref 5–15)
BUN: 11 mg/dL (ref 8–23)
CO2: 22 mmol/L (ref 22–32)
Calcium: 8.9 mg/dL (ref 8.9–10.3)
Chloride: 101 mmol/L (ref 98–111)
Creatinine, Ser: 0.9 mg/dL (ref 0.61–1.24)
GFR, Estimated: >60 mL/min (ref >60)
Glucose, Bld: 143 mg/dL — ABNORMAL HIGH (ref 70–99)
Potassium: 3.8 mmol/L (ref 3.5–5.1)
Sodium: 133 mmol/L — ABNORMAL LOW (ref 135–145)

## 2020-10-18 LAB — CBC
HCT: 45.3 % (ref 39.0–52.0)
Hemoglobin: 15.6 g/dL (ref 13.0–17.0)
MCH: 32.8 pg (ref 26.0–34.0)
MCHC: 34.4 g/dL (ref 30.0–36.0)
MCV: 95.2 fL (ref 80.0–100.0)
Platelets: 217 10*3/uL (ref 150–400)
RBC: 4.76 MIL/uL (ref 4.22–5.81)
RDW: 12.4 % (ref 11.5–15.5)
WBC: 10.9 10*3/uL — ABNORMAL HIGH (ref 4.0–10.5)
nRBC: 0 % (ref 0.0–0.2)

## 2020-10-18 LAB — HEMOGLOBIN A1C
Hgb A1c MFr Bld: 6 % — ABNORMAL HIGH (ref 4.8–5.6)
Mean Plasma Glucose: 126 mg/dL

## 2020-10-18 LAB — GLUCOSE, CAPILLARY
Glucose-Capillary: 138 mg/dL — ABNORMAL HIGH (ref 70–99)
Glucose-Capillary: 104 mg/dL — ABNORMAL HIGH (ref 70–99)
Glucose-Capillary: 113 mg/dL — ABNORMAL HIGH (ref 70–99)

## 2020-10-18 MED ORDER — GLIMEPIRIDE 1 MG PO TABS
1.0000 mg | ORAL_TABLET | Freq: Every day | ORAL | Status: DC
  Administered 2020-10-18 – 2020-10-19 (×2): 1 mg via ORAL
  Filled 2020-10-18 (×2): qty 1

## 2020-10-18 MED ORDER — CLOPIDOGREL BISULFATE 75 MG PO TABS
600.0000 mg | ORAL_TABLET | Freq: Once | ORAL | Status: AC
  Administered 2020-10-18: 600 mg via ORAL
  Filled 2020-10-18: qty 8

## 2020-10-18 MED ORDER — INSULIN ASPART 100 UNIT/ML ~~LOC~~ SOLN
0.0000 [IU] | Freq: Three times a day (TID) | SUBCUTANEOUS | Status: DC
  Administered 2020-10-19: 2 [IU] via SUBCUTANEOUS
  Administered 2020-10-19: 1 [IU] via SUBCUTANEOUS

## 2020-10-18 MED ORDER — CLOPIDOGREL BISULFATE 75 MG PO TABS
75.0000 mg | ORAL_TABLET | Freq: Every day | ORAL | Status: DC
  Administered 2020-10-19: 75 mg via ORAL
  Filled 2020-10-18: qty 1

## 2020-10-18 NOTE — Progress Notes (Signed)
  Echocardiogram 2D Echocardiogram has been performed.  Joshua Kent 10/18/2020, 10:50 AM

## 2020-10-18 NOTE — Progress Notes (Signed)
CARDIAC REHAB PHASE I   PRE:  Rate/Rhythm: 80 SR    BP: sitting 126/87    SaO2:   MODE:  Ambulation: 400 ft   POST:  Rate/Rhythm: 89 SR, 100  ST randomly in recliner    BP: sitting 121/88     SaO2:   Pt without c/o walking. HR up to 100 ST at times then down again, with activity and rest. Discussed MI, stent, Brilinta, diet, exercise, NTG and CRPII. Pt hard of hearing and tangential. Voiced reception of Brilinta importance. Will refer to [facility].  [phone]   Harriet Masson CES, ACSM 10/18/2020 10:00 AM

## 2020-10-19 LAB — GLUCOSE, CAPILLARY
Glucose-Capillary: 138 mg/dL — ABNORMAL HIGH (ref 70–99)
Glucose-Capillary: 187 mg/dL — ABNORMAL HIGH (ref 70–99)

## 2020-10-19 MED ORDER — ATORVASTATIN CALCIUM 80 MG PO TABS: 80.0000 mg | ORAL_TABLET | Freq: Every day | ORAL | 3 refills | Status: AC

## 2020-10-19 MED ORDER — CLOPIDOGREL BISULFATE 75 MG PO TABS: 75.0000 mg | ORAL_TABLET | Freq: Every day | ORAL | 3 refills | Status: AC

## 2020-10-19 MED ORDER — ASPIRIN 81 MG PO CHEW: 81.0000 mg | CHEWABLE_TABLET | Freq: Every day | ORAL | Status: AC

## 2020-10-19 MED ORDER — GLIMEPIRIDE 1 MG PO TABS: 1.0000 mg | ORAL_TABLET | Freq: Every day | ORAL | 3 refills | Status: AC

## 2020-10-19 NOTE — Progress Notes (Signed)
Pt received discharge instructions and does not have any additional questions about his new medications or how to take care of his femoral site. Vss are stable and pt is ready for discharge.

## 2020-10-19 NOTE — Discharge Summary (Signed)
Physician Discharge Summary  Patient ID: ARIANA CAVENAUGH MRN: [id number] DOB/AGE: Jun 29, 1946 74 y.o.  Admit date: 10/16/2020 Discharge date: 10/19/2020  Admission Diagnoses: Acute coronary syndrome GERD Seizure disorder  Discharge Diagnoses:  Principle problem: Acute antero-lateral wall MI Active Problems:   Multivessel, native vessel CAD   Ischemic cardiomyopathy   New onset type 2 DM   LAD stent   GERD   Seizure disorder   Mild tricuspid valve regurgitation    Discharged Condition: fair  Hospital Course: 74 years old white male with one month of exertional dyspnea presented with acute high lateral wall MI. His HS-troponin I was elevated at 1,700 ng. and EKG showed 1 mm ST elevation in lead I and aVL with subtle elevation in anterior leads. He underwent cardiac catheterization that showed significant large LAD proximal to mid vessel disease, OM 1 disease and severe long lesion in narrow Ramus branch. He had 3.0 x15 mm Resolute Onyx DES stent placed in LAD immediately after the take off of 1st septal perforator by Dr. Tresa Endo. 95 % LAD stenosis was reduced to 0%. Distal diffuse disease of LAD became more evident post procedure. Patient had elevated blood sugar levels and was started on low dose glimepiride and will be switched to low dose metformin post follow up in 1 week. He is on low dose B-blocker. Low dose Losartan or Entresto will be added on OP basis as blood pressure allows. His echocardiogram showed normal basal segment wall motion, moderate mid segment hypokinesia and severe apical wall hypokinesia with EF 40-45 %. He will also have OP cardiac rehab. He understood not do do heavy lifting for 3 months till LV function is rechecked. He will see me in 1 week and primary care physician in 2 weeks. He will follow heart healthy and carb modified diet and take aspirin and clopidogrel at least one year.  Consults: cardiology  Significant Diagnostic Studies: labs: CBC was near  normal. BMET was near normal except blood glucose of 144 mg. Hgb A1C was 6.0 %. Total cholesterol was 175 mg., LDL was 97 mg., HDl was 45 mg. And Triglycerides was 166 mg.  EKG showed NSR, LVH and antero-lateral wall MI  Chest x-ray: unremarkable.  Cardiac cath: Moderate LV systolic dysfunction with severe LAD, OM1 and Ramus branch disease and non-obstructive multivessel disease. Post 3.00 x 15 mm DES stent in LAD 95 % stenosis was reduced to 0 %.  Treatments: cardiac meds: metoprolol, Aspirin, Clopidogrel and atorvastatin.  Discharge Exam: Blood pressure 111/72, pulse 82, temperature (!) 97.5 F (36.4 C), resp. rate 18, height 5\' 8"  (1.727 m), weight 77.6 kg, SpO2 98 %. General appearance: alert, cooperative and appears stated age. Head: Normocephalic, atraumatic. Eyes: Blue eyes, pink conjunctiva, corneas clear. PERRL, EOM's intact.  Neck: No adenopathy, no carotid bruit, no JVD, supple, symmetrical, trachea midline and thyroid not enlarged. Resp: Clear to auscultation bilaterally. Cardio: Regular rate and rhythm, S1, S2 normal, II/VI systolic murmur, no click, rub or gallop. GI: Soft, non-tender; bowel sounds normal; no organomegaly. Extremities: No edema, cyanosis or clubbing. Right groin cath sight was normal. Skin: Warm and dry.  Neurologic: Alert and oriented X 3, normal strength and tone. Normal coordination and gait.  Disposition: Discharge disposition: 01-Home or Self Care       Discharge Instructions    Amb Referral to Cardiac Rehabilitation   Complete by: As directed    Diagnosis:  Coronary Stents NSTEMI PTCA     After initial evaluation and assessments completed: Virtual  Based Care may be provided alone or in conjunction with Phase 2 Cardiac Rehab based on patient barriers.: Yes     Allergies as of 10/19/2020      Reactions   Cetirizine Other (See Comments)   Ramipril Other (See Comments)      Medication List    STOP taking these medications    pravastatin 40 MG tablet Commonly known as: PRAVACHOL     TAKE these medications   aspirin 81 MG chewable tablet Chew 1 tablet (81 mg total) by mouth daily. Start taking on: October 20, 2020   atorvastatin 80 MG tablet Commonly known as: LIPITOR Take 1 tablet (80 mg total) by mouth daily. Start taking on: October 20, 2020   clopidogrel 75 MG tablet Commonly known as: PLAVIX Take 1 tablet (75 mg total) by mouth daily. Start taking on: October 20, 2020   famotidine 40 MG tablet Commonly known as: PEPCID Take 40 mg by mouth daily.   glimepiride 1 MG tablet Commonly known as: AMARYL Take 1 tablet (1 mg total) by mouth daily with breakfast. Start taking on: October 20, 2020   metoprolol succinate 50 MG 24 hr tablet Commonly known as: TOPROL-XL Take 25 mg by mouth daily.   nitroGLYCERIN 0.3 MG SL tablet Commonly known as: NITROSTAT Place 0.3 mg under the tongue every 5 (five) minutes as needed for chest pain.   phenytoin 100 MG ER capsule Commonly known as: DILANTIN Take 200 mg by mouth 2 (two) times daily.       Follow-up Information    Everlean Cherry, MD. Schedule an appointment as soon as possible for a visit in 2 week(s).   Specialty: Family Medicine Contact information: [address] [place] [address] [phone]        Orpah Cobb, MD. Schedule an appointment as soon as possible for a visit in 1 week(s).   Specialty: Cardiology Contact information: [address] [place] [address] [phone]               Time spent: Review of old chart, current chart, lab, x-ray, cardiac tests and discussion with patient over 60 minutes.  Signed: Ricki Rodriguez 10/19/2020, 9:47 AM

## 2020-10-19 NOTE — Progress Notes (Signed)
CARDIAC REHAB PHASE I   PRE:  Rate/Rhythm: 76 SR    BP: sitting 117/74    SaO2:   MODE:  Ambulation: 940 ft   POST:  Rate/Rhythm: 91 SR    BP: sitting 129/80     SaO2:   Tolerated well although HR slow to decrease after walk. No c/o. Eager to d/c. Read educational materials yesterday.  [id number]  Harriet Masson CES, ACSM 10/19/2020 8:26 AM

## 2020-10-19 NOTE — Discharge Instructions (Signed)
Heart Attack A heart attack occurs when blood and oxygen supply to the heart is cut off. A heart attack causes damage to the heart that cannot be fixed. A heart attack is also called a myocardial infarction, or MI. If you think you are having a heart attack, do not wait to see if the symptoms will go away. Get medical help right away. What are the causes? This condition may be caused by:  A fatty substance (plaque) in the blood vessels (arteries). This can block the flow of blood to the heart.  A blood clot in the blood vessels that go to the heart. The blood clot blocks blood flow.  Low blood pressure.  An abnormal heartbeat.  Some diseases, such as problems in red blood cells (anemia)orproblems in breathing (respiratory failure).  Tightening (spasm) of a blood vessel that cuts off blood to the heart.  A tear in a blood vessel of the heart.  High blood pressure. What increases the risk? The following factors may make you more likely to develop this condition:  Aging. The older you are, the higher your risk.  Having a personal or family history of chest pain, heart attack, stroke, or narrowing of the arteries in the legs, arms, head, or stomach (peripheral artery disease).  Being male.  Smoking.  Not getting regular exercise.  Being overweight or obese.  Having high blood pressure.  Having high cholesterol.  Having diabetes.  Drinking too much alcohol.  Using illegal drugs, such as cocaine or methamphetamine. What are the signs or symptoms? Symptoms of this condition include:  Chest pain. It may feel like: ? Crushing or squeezing. ? Tightness, pressure, fullness, or heaviness.  Pain in the arm, neck, jaw, back, or upper body.  Shortness of breath.  Heartburn.  Upset stomach (indigestion).  Feeling like you may vomit (nauseous).  Cold sweats.  Feeling tired.  Sudden light-headedness. How is this treated? A heart attack must be treated as soon as  possible. Treatment may include:  Medicines to: ? Break up or dissolve blood clots. ? Thin blood and help prevent blood clots. ? Treat blood pressure. ? Improve blood flow to the heart. ? Reduce pain. ? Reduce cholesterol.  Procedures to widen a blocked artery and keep it open.  Open heart surgery.  Receiving oxygen.  Making your heart strong again (cardiac rehabilitation) through exercise, education, and counseling. Follow these instructions at home: Medicines  Take over-the-counter and prescription medicines only as told by your doctor. You may need to take medicine: ? To keep your blood from clotting too easily. ? To control blood pressure. ? To lower cholesterol. ? To control heart rhythms.  Do not take these medicines unless your doctor says it is okay: ? NSAIDs, such as ibuprofen. ? Supplements that have vitamin A, vitamin E, or both. ? Hormone replacement therapy that has estrogen with or without progestin. Lifestyle      Do not use any products that have nicotine or tobacco, such as cigarettes, e-cigarettes, and chewing tobacco. If you need help quitting, ask your doctor.  Avoid secondhand smoke.  Exercise regularly. Ask your doctor about a cardiac rehab program.  Eat heart-healthy foods. Your doctor will tell you what foods to eat.  Stay at a healthy weight.  Lower your stress level.  Do not use illegal drugs. Alcohol use  Do not drink alcohol if: ? Your doctor tells you not to drink. ? You are pregnant, may be pregnant, or are planning to become pregnant.    If you drink alcohol: ? Limit how much you use to:  0-1 drink a day for women.  0-2 drinks a day for men. ? Know how much alcohol is in your drink. In the [place] one drink equals one 12 oz bottle of beer (355 mL), one 5 oz glass of wine (148 mL), or one 1 oz glass of hard liquor (44 mL). General instructions  Work with your doctor to treat other problems you may have, such as diabetes or high  blood pressure.  Get screened for depression. Get treatment if needed.  Keep your vaccines up to date. Get the flu shot (influenza vaccine) every year.  Keep all follow-up visits as told by your doctor. This is important. Contact a doctor if:  You feel very sad.  You have trouble doing your daily activities. Get help right away if:  You have sudden, unexplained discomfort in your chest, arms, back, neck, jaw, or upper body.  You have shortness of breath.  You have sudden sweating or clammy skin.  You feel like you may vomit.  You vomit.  You feel tired or weak.  You get light-headed or dizzy.  You feel your heart beating fast.  You feel your heart skipping beats.  You have blood pressure that is higher than 180/120. These symptoms may be an emergency. Do not wait to see if the symptoms will go away. Get medical help right away. Call your local emergency services (911 in the [place] Do not drive yourself to the hospital. Summary  A heart attack occurs when blood and oxygen supply to the heart is cut off.  Do not take NSAIDs unless your doctor says it is okay.  Do not smoke. Avoid secondhand smoke.  Exercise regularly. Ask your doctor about a cardiac rehab program. This information is not intended to replace advice given to you by your health care provider. Make sure you discuss any questions you have with your health care provider. Document Revised: 03/22/2019 Document Reviewed: 03/22/2019 Elsevier Patient Education  2020 [employer]  

## 2020-11-06 ENCOUNTER — Telehealth (HOSPITAL_COMMUNITY): Payer: Self-pay

## 2020-11-06 NOTE — Telephone Encounter (Signed)
Faxed cardiac rehab referral to [facility] per phase I. 

## 2020-11-26 NOTE — Progress Notes (Signed)
Late entry Ref: Everlean Cherry, MD   Subjective:  Awake. No chest pain. VS stable. S/P LAD stent  Objective:  Vital Signs in the last 24 hours:  P: 80, R: 18, BP: 110/70.  Physical Exam: BP Readings from Last 1 Encounters:  10/19/20 108/71     Wt Readings from Last 1 Encounters:  10/19/20 77.6 kg    Weight change:  Body mass index is 26 kg/m. HEENT: [place]/AT, Eyes-Blue, Conjunctiva-Pink, Sclera-Non-icteric Neck: No JVD, No bruit, Trachea midline. Lungs:  Clear, Bilateral. Cardiac:  Regular rhythm, normal S1 and S2, no S3. II/VI systolic murmur. Abdomen:  Soft, non-tender. BS present. Extremities:  No edema present. No cyanosis. No clubbing. CNS: AxOx3, Cranial nerves grossly intact, moves all 4 extremities.  Skin: Warm and dry.   Intake/Output from previous day: No intake/output data recorded.    Lab Results: BMET    Component Value Date/Time   NA 133 (L) 10/18/2020 0140   NA 136 10/17/2020 0549   NA 133 (L) 10/16/2020 1925   K 3.8 10/18/2020 0140   K 4.1 10/17/2020 0549   K 3.9 10/16/2020 1925   CL 101 10/18/2020 0140   CL 101 10/17/2020 0549   CL 99 10/16/2020 1925   CO2 22 10/18/2020 0140   CO2 24 10/17/2020 0549   CO2 23 10/16/2020 1925   GLUCOSE 143 (H) 10/18/2020 0140   GLUCOSE 144 (H) 10/17/2020 0549   GLUCOSE 134 (H) 10/16/2020 1925   BUN 11 10/18/2020 0140   BUN 10 10/17/2020 0549   BUN 14 10/16/2020 1925   CREATININE 0.90 10/18/2020 0140   CREATININE 0.96 10/17/2020 0549   CREATININE 1.01 10/16/2020 1925   CALCIUM 8.9 10/18/2020 0140   CALCIUM 9.0 10/17/2020 0549   CALCIUM 8.6 (L) 10/16/2020 1925   GFRNONAA >60 10/18/2020 0140   GFRNONAA >60 10/17/2020 0549   GFRNONAA >60 10/16/2020 1925   CBC    Component Value Date/Time   WBC 10.9 (H) 10/18/2020 0140   RBC 4.76 10/18/2020 0140   HGB 15.6 10/18/2020 0140   HCT 45.3 10/18/2020 0140   PLT 217 10/18/2020 0140   MCV 95.2 10/18/2020 0140   MCH 32.8 10/18/2020 0140   MCHC 34.4 10/18/2020  0140   RDW 12.4 10/18/2020 0140   LYMPHSABS 3.0 10/16/2020 1925   MONOABS 0.7 10/16/2020 1925   EOSABS 0.3 10/16/2020 1925   BASOSABS 0.0 10/16/2020 1925   HEPATIC Function Panel No results for input(s): PROT in the last 8760 hours.  Invalid input(s):  ALBUMIN,  AST,  ALT,  ALKPHOS,  BILIDIR,  IBILI HEMOGLOBIN A1C No components found for: HGA1C,  MPG CARDIAC ENZYMES No results found for: CKTOTAL, CKMB, CKMBINDEX, TROPONINI BNP No results for input(s): PROBNP in the last 8760 hours. TSH No results for input(s): TSH in the last 8760 hours. CHOLESTEROL Recent Labs    10/17/20 0549  CHOL 175    Scheduled Meds: Continuous Infusions: PRN Meds:.  Assessment/Plan: Acute antero-lateral wall MI Multivessel CAD New onset type 2 DM LAD stent GERD Seizure disorder  Increase activity. Home in AM.   LOS: 3 days   Time spent including chart review, lab review, examination, discussion with patient : 25 min   Orpah Cobb  MD  11/26/2020, 12:32 PM

## 2021-10-03 IMAGING — DX DG CHEST 1V PORT
1 series · 1 of 1 positions shown · non-contrast
Comparison: March 16, 2016

CLINICAL DATA: Chest pain.

EXAM:
PORTABLE CHEST 1 VIEW

[chest ap]
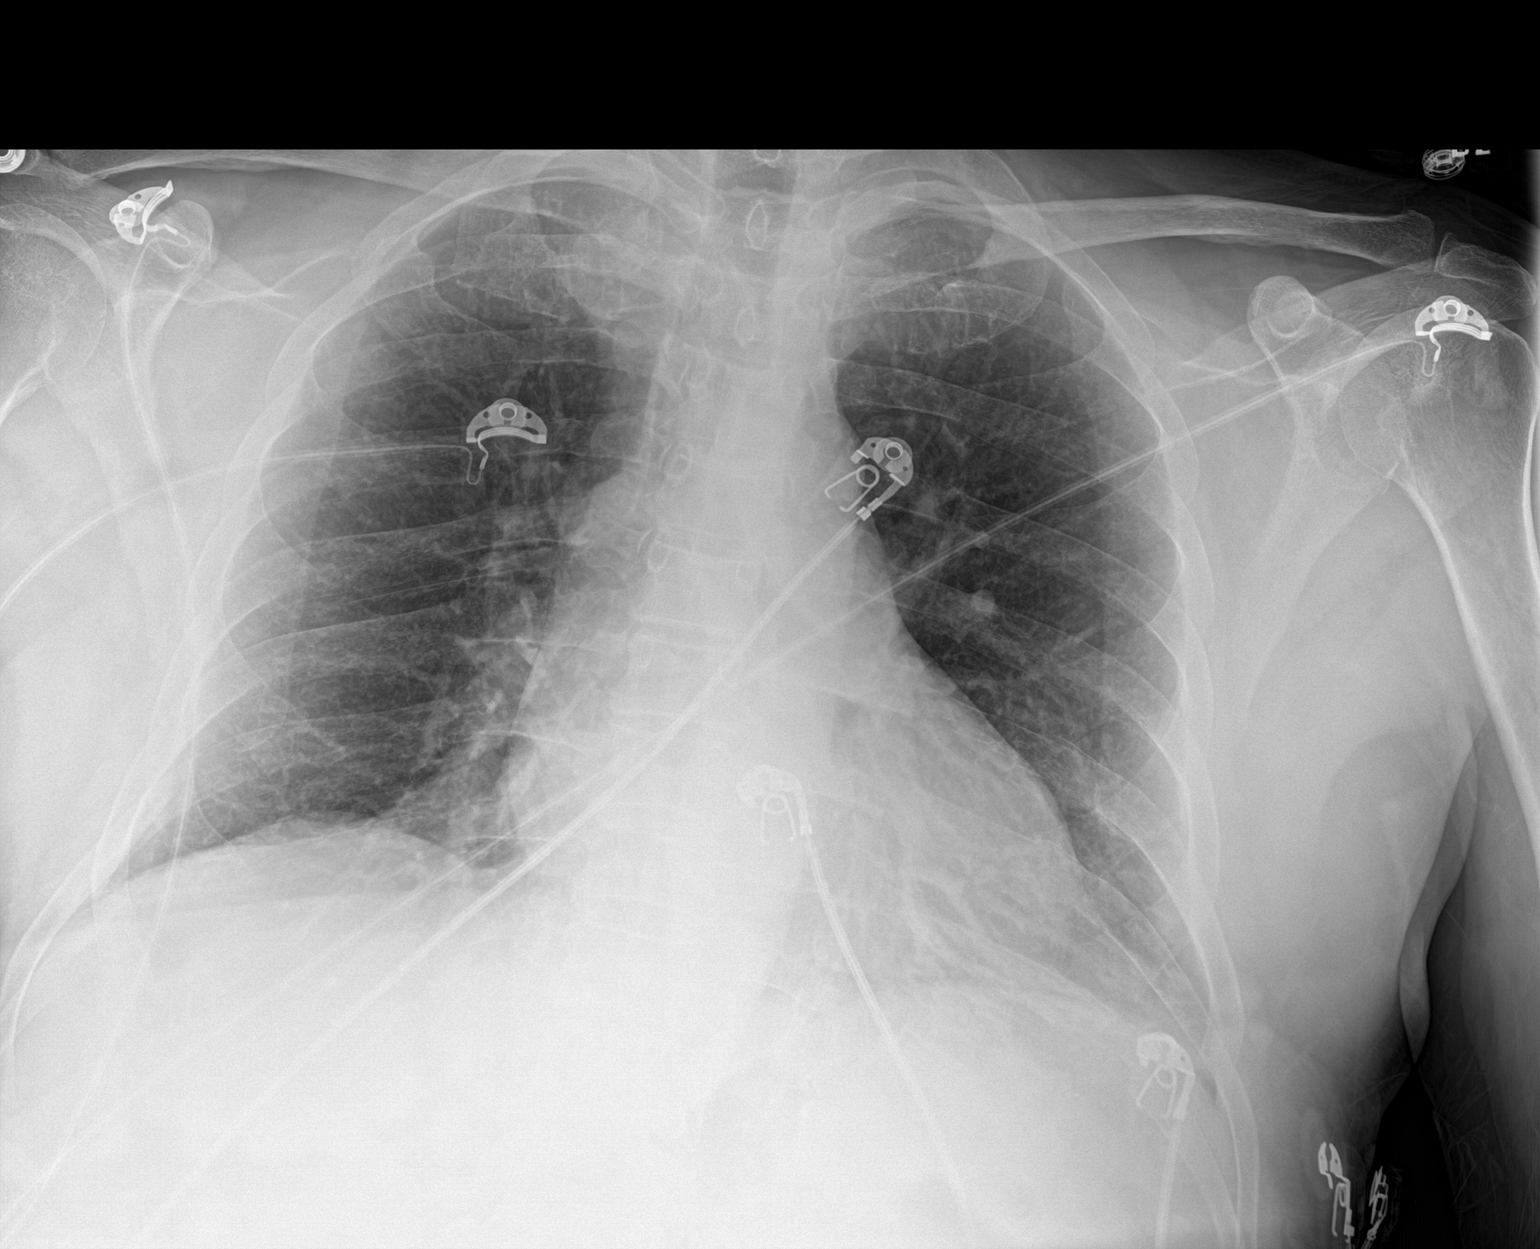

[1 of 1 positions shown; findings below may reference images not displayed]

FINDINGS: The heart size and mediastinal contours are within normal limits.
Both lungs are clear. The visualized skeletal structures are
unremarkable.
IMPRESSION: No active disease.
